# Patient Record
Sex: Male | Born: 1990 | Race: White | Hispanic: No | Marital: Single | Smoking: Never smoker
Health system: Southern US, Community
[De-identification: ages and names within clinical notes are randomized; demographics above are authoritative.]

---

## 2015-05-30 ENCOUNTER — Encounter (HOSPITAL_COMMUNITY): Payer: Self-pay | Admitting: *Deleted

## 2015-05-30 ENCOUNTER — Emergency Department (HOSPITAL_COMMUNITY)
Admission: EM | Admit: 2015-05-30 | Discharge: 2015-05-30 | Disposition: A | Discharge status: Home / Self Care | Payer: Managed Care, Other (non HMO) | Attending: Emergency Medicine | Admitting: Emergency Medicine

## 2015-05-30 ENCOUNTER — Emergency Department (HOSPITAL_COMMUNITY): Payer: Managed Care, Other (non HMO)

## 2015-05-30 DIAGNOSIS — S6991XA Unspecified injury of right wrist, hand and finger(s), initial encounter: Secondary | ICD-10-CM | POA: Diagnosis present

## 2015-05-30 DIAGNOSIS — Y9289 Other specified places as the place of occurrence of the external cause: Secondary | ICD-10-CM | POA: Diagnosis not present

## 2015-05-30 DIAGNOSIS — W25XXXA Contact with sharp glass, initial encounter: Secondary | ICD-10-CM | POA: Diagnosis not present

## 2015-05-30 DIAGNOSIS — Y93G1 Activity, food preparation and clean up: Secondary | ICD-10-CM | POA: Diagnosis not present

## 2015-05-30 DIAGNOSIS — S61411A Laceration without foreign body of right hand, initial encounter: Secondary | ICD-10-CM

## 2015-05-30 DIAGNOSIS — Y999 Unspecified external cause status: Secondary | ICD-10-CM | POA: Insufficient documentation

## 2015-05-30 MED ORDER — FENTANYL CITRATE (PF) 100 MCG/2ML IJ SOLN: 50.0000 ug | Freq: Once | INTRAMUSCULAR | Status: DC

## 2015-05-30 MED ORDER — TETANUS-DIPHTH-ACELL PERTUSSIS 5-2.5-18.5 LF-MCG/0.5 IM SUSP
0.5000 mL | Freq: Once | INTRAMUSCULAR | Status: AC
  Administered 2015-05-30: 0.5 mL via INTRAMUSCULAR
  Filled 2015-05-30: qty 0.5

## 2015-05-30 MED ORDER — LIDOCAINE HCL (PF) 1 % IJ SOLN
5.0000 mL | Freq: Once | INTRAMUSCULAR | Status: AC
  Administered 2015-05-30: 5 mL
  Filled 2015-05-30: qty 5

## 2015-05-30 MED ORDER — IBUPROFEN 100 MG/5ML PO SUSP
800.0000 mg | Freq: Once | ORAL | Status: AC
  Administered 2015-05-30: 800 mg via ORAL
  Filled 2015-05-30: qty 40

## 2015-05-30 NOTE — ED Notes (Signed)
The pt cut his rt index finger while washing dishes.  Bleeding controlled with a bandage

## 2015-05-30 NOTE — ED Provider Notes (Signed)
CSN: [id number]     Arrival date & time 05/30/15  7829 History  By signing my name below, I, Ronney Lion, attest that this documentation has been prepared under the direction and in the presence of Samantha Dowless, PA-C. Electronically Signed: Ronney Lion, ED Scribe. 05/30/2015. 10:44 PM.    Chief Complaint  Patient presents with  . Laceration   The history is provided by the patient. No language interpreter was used.    HPI Comments: Marc Simmons is a 25 y.o. male who presents to the Emergency Department complaining of a sudden-onset, constant right index finger laceration with constant, moderate, associated pain, that onset about 2 hours ago. Patient states he had cut his finger on a glass cup that broke while washing dishes. Movement and palpation exacerbate his pain. He denies a history of chronic medical conditions, including DM or any other conditions that might affect wound-healing. Patient states he believes his tetanus vaccination is out of date.   History reviewed. No pertinent past medical history. History reviewed. No pertinent past surgical history. No family history on file. Social History  Substance Use Topics  . Smoking status: Never Smoker   . Smokeless tobacco: None  . Alcohol Use: Yes    Review of Systems  Skin: Positive for wound.  Neurological: Negative for weakness.    Allergies  Review of patient's allergies indicates no known allergies.  Home Medications   Prior to Admission medications   Not on File   BP 146/86 mmHg  Pulse 62  Temp(Src) 97.6 F (36.4 C) (Oral)  Resp 18  Ht  (1.778 m)  Wt 139 lb (63.05 kg)  BMI 19.94 kg/m2  SpO2 100% Physical Exam  Constitutional: He is oriented to person, place, and time. He appears well-developed and well-nourished. No distress.  HENT:  Head: Normocephalic and atraumatic.  Eyes: Conjunctivae are normal. Right eye exhibits no discharge. Left eye exhibits no discharge. No scleral icterus.  Cardiovascular:  Normal rate and intact distal pulses.   Pulmonary/Chest: Effort normal.  Musculoskeletal:  2 cm laceration over medial aspect of first MCP joint. No foreign bodies seen or palpated. No evidence of tendon injury. No decreased ROM of digits. Good capillary refill (<2 seconds).   Neurological: He is alert and oriented to person, place, and time. Coordination normal.  No sensory deficits.   Skin: Skin is warm and dry. No rash noted. He is not diaphoretic. No erythema. No pallor.  Psychiatric: He has a normal mood and affect. His behavior is normal.  Nursing note and vitals reviewed.   ED Course  Procedures (including critical care time)  DIAGNOSTIC STUDIES: Oxygen Saturation is 100% on RA, normal by my interpretation.    COORDINATION OF CARE: 8:45 PM - Discussed treatment plan with pt at bedside which includes right hand XR to r/o foreign bodies and dosage of pain-relieving medication (ibuprofen) administered here. Pt verbalized understanding and agreed to plan.   Imaging Review Dg Hand Complete Right  05/30/2015  CLINICAL DATA:  Laceration at the base of the second finger from broken cup, possible foreign body EXAM: RIGHT HAND - COMPLETE 3+ VIEW COMPARISON:  None. FINDINGS: No acute fracture or dislocation is noted. Soft tissue abnormality is noted consistent with the given clinical history. No radiopaque foreign body is seen. IMPRESSION: Soft tissue injury without acute bony abnormality or radiopaque foreign body. Electronically Signed   By: Alcide Clever M.D.   On: 05/30/2015 21:18   I have personally reviewed and evaluated these images  and lab results as part of my medical decision-making.  LACERATION REPAIR PROCEDURE NOTE The patient's identification was confirmed and consent was obtained. This procedure was performed by Gaylyn RongSamantha Dowless, PA-C, at 10:30 PM. Site: Right index finger Sterile procedures observed Anesthetic used (type and amt): Lidocaine 1% w/ epi, 5 mL Suture type/size:  5-0 prolene Length: 2 cm # of Sutures: 3 Technique: Simple interrupted Antibx ointment applied Tetanus ordered Site anesthetized, irrigated with NS, explored without evidence of foreign body, wound well approximated, site covered with dry, sterile dressing.  Patient tolerated procedure well without complications. Instructions for care discussed verbally and patient provided with additional written instructions for homecare and f/u.   MDM   Final diagnoses:  Laceration of hand, right, initial encounter   Tdap booster given.Pressure irrigation performed. Laceration occurred < 8 hours prior to repair which was well tolerated. Pt has no co morbidities to effect normal wound healing. Discussed suture home care w pt and answered questions. Pt to f-u for wound check and suture removal in 7 days. Pt is hemodynamically stable w no complaints prior to dc.     I personally performed the services described in this documentation, which was scribed in my presence. The recorded information has been reviewed and is accurate.       Lester KinsmanSamantha Tripp HarwoodDowless, PA-C 05/31/15 0128  Bethann BerkshireJoseph Zammit, MD 05/31/15 [phone]

## 2015-05-30 NOTE — ED Notes (Signed)
Pt to xray at this time.

## 2015-05-30 NOTE — ED Notes (Signed)
PT st's he was washing a glass and it broke.  Pt has lac to base of right index finger.

## 2015-05-30 NOTE — Discharge Instructions (Signed)
Laceration Care, Adult °A laceration is a cut that goes through all of the layers of the skin and into the tissue that is right under the skin. Some lacerations heal on their own. Others need to be closed with stitches (sutures), staples, skin adhesive strips, or skin glue. Proper laceration care minimizes the risk of infection and helps the laceration to heal better. °HOW TO CARE FOR YOUR LACERATION °If sutures or staples were used: °· Keep the wound clean and dry. °· If you were given a bandage (dressing), you should change it at least one time per day or as told by your health care provider. You should also change it if it becomes wet or dirty. °· Keep the wound completely dry for the first 24 hours or as told by your health care provider. After that time, you may shower or bathe. However, make sure that the wound is not soaked in water until after the sutures or staples have been removed. °· Clean the wound one time each day or as told by your health care provider: °¨ Wash the wound with soap and water. °¨ Rinse the wound with water to remove all soap. °¨ Pat the wound dry with a clean towel. Do not rub the wound. °· After cleaning the wound, apply a thin layer of antibiotic ointment as told by your health care provider. This will help to prevent infection and keep the dressing from sticking to the wound. °· Have the sutures or staples removed as told by your health care provider. °If skin adhesive strips were used: °· Keep the wound clean and dry. °· If you were given a bandage (dressing), you should change it at least one time per day or as told by your health care provider. You should also change it if it becomes dirty or wet. °· Do not get the skin adhesive strips wet. You may shower or bathe, but be careful to keep the wound dry. °· If the wound gets wet, pat it dry with a clean towel. Do not rub the wound. °· Skin adhesive strips fall off on their own. You may trim the strips as the wound heals. Do not  remove skin adhesive strips that are still stuck to the wound. They will fall off in time. °If skin glue was used: °· Try to keep the wound dry, but you may briefly wet it in the shower or bath. Do not soak the wound in water, such as by swimming. °· After you have showered or bathed, gently pat the wound dry with a clean towel. Do not rub the wound. °· Do not do any activities that will make you sweat heavily until the skin glue has fallen off on its own. °· Do not apply liquid, cream, or ointment medicine to the wound while the skin glue is in place. Using those may loosen the film before the wound has healed. °· If you were given a bandage (dressing), you should change it at least one time per day or as told by your health care provider. You should also change it if it becomes dirty or wet. °· If a dressing is placed over the wound, be careful not to apply tape directly over the skin glue. Doing that may cause the glue to be pulled off before the wound has healed. °· Do not pick at the glue. The skin glue usually remains in place for 5-10 days, then it falls off of the skin. °General Instructions °· Take over-the-counter and prescription   medicines only as told by your health care provider.  If you were prescribed an antibiotic medicine or ointment, take or apply it as told by your doctor. Do not stop using it even if your condition improves.  To help prevent scarring, make sure to cover your wound with sunscreen whenever you are outside after stitches are removed, after adhesive strips are removed, or when glue remains in place and the wound is healed. Make sure to wear a sunscreen of at least 30 SPF.  Do not scratch or pick at the wound.  Keep all follow-up visits as told by your health care provider. This is important.  Check your wound every day for signs of infection. Watch for:  Redness, swelling, or pain.  Fluid, blood, or pus.  Raise (elevate) the injured area above the level of your heart  while you are sitting or lying down, if possible. SEEK MEDICAL CARE IF:  You received a tetanus shot and you have swelling, severe pain, redness, or bleeding at the injection site.  You have a fever.  A wound that was closed breaks open.  You notice a bad smell coming from your wound or your dressing.  You notice something coming out of the wound, such as wood or glass.  Your pain is not controlled with medicine.  You have increased redness, swelling, or pain at the site of your wound.  You have fluid, blood, or pus coming from your wound.  You notice a change in the color of your skin near your wound.  You need to change the dressing frequently due to fluid, blood, or pus draining from the wound.  You develop a new rash.  You develop numbness around the wound. SEEK IMMEDIATE MEDICAL CARE IF:  You develop severe swelling around the wound.  Your pain suddenly increases and is severe.  You develop painful lumps near the wound or on skin that is anywhere on your body.  You have a red streak going away from your wound.  The wound is on your hand or foot and you cannot properly move a finger or toe.  The wound is on your hand or foot and you notice that your fingers or toes look pale or bluish.   This information is not intended to replace advice given to you by your health care provider. Make sure you discuss any questions you have with your health care provider.   Follow-up with your primary care provider or an urgent care for suture removal in 7-10 days. Keep wound clean and dry. May wash with soap and water. Return to the emergency department if you experience redness or swelling around the wound, inability to move your finger, fevers, chills.

## 2017-03-24 IMAGING — CR DG HAND COMPLETE 3+V*R*
3 series · 3 of 3 positions shown · non-contrast
Comparison: None.

CLINICAL DATA: Laceration at the base of the second finger from
broken cup, possible foreign body

EXAM:
RIGHT HAND - COMPLETE 3+ VIEW

[hand pa]
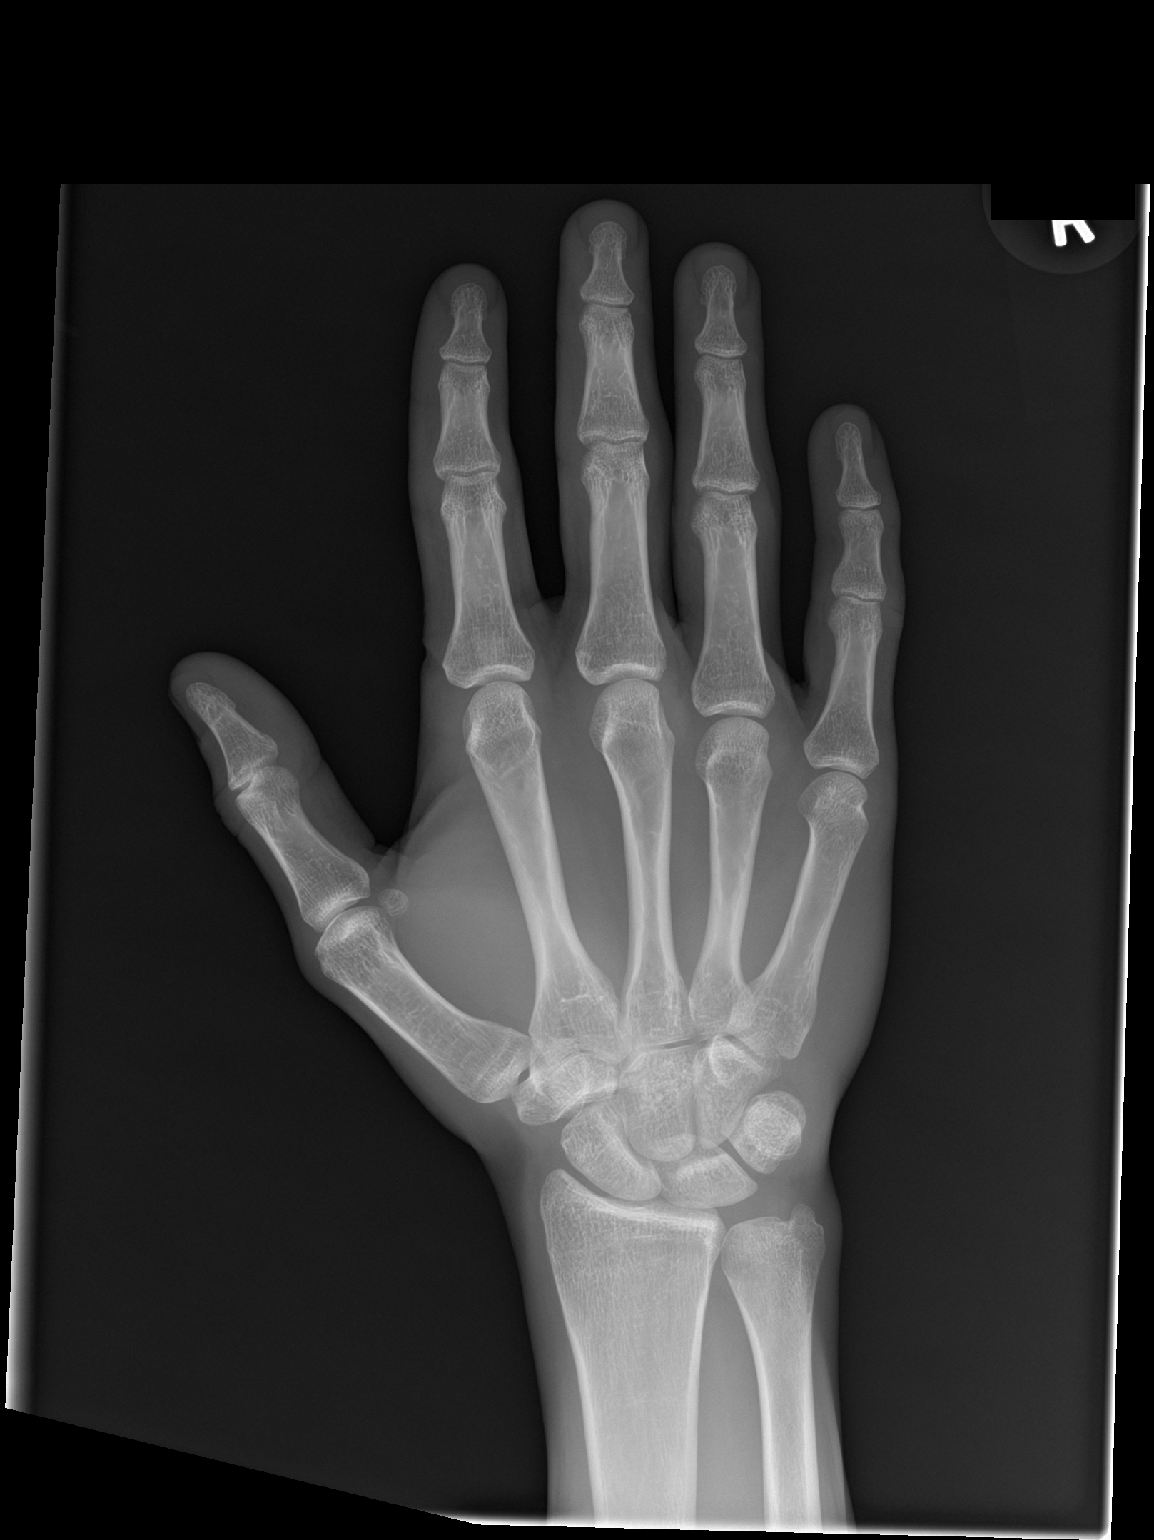

[hand obl]
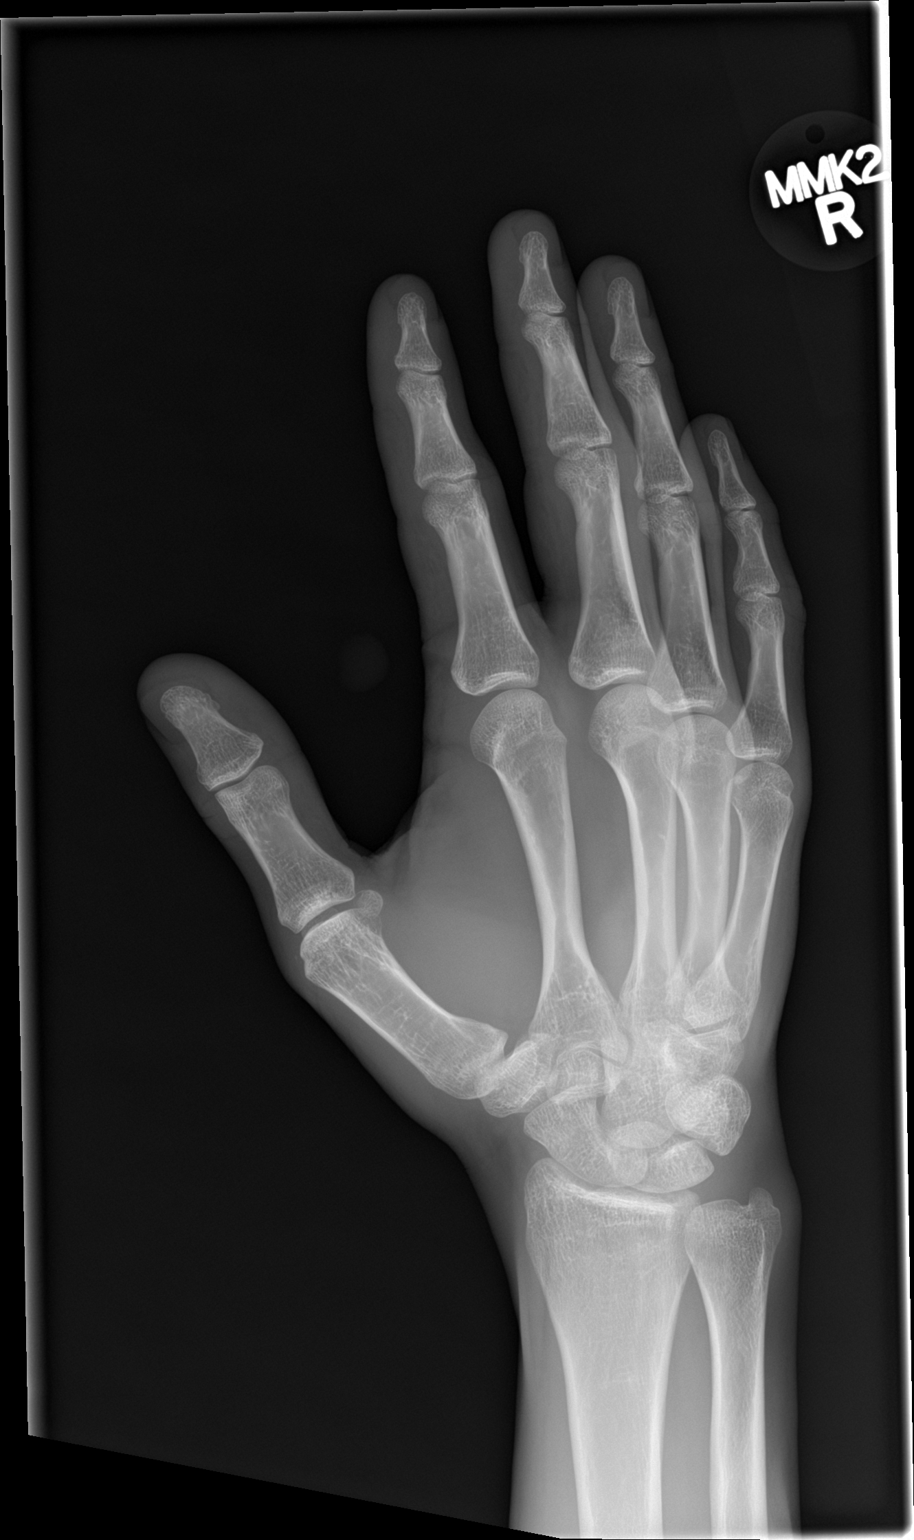

[hand lat]
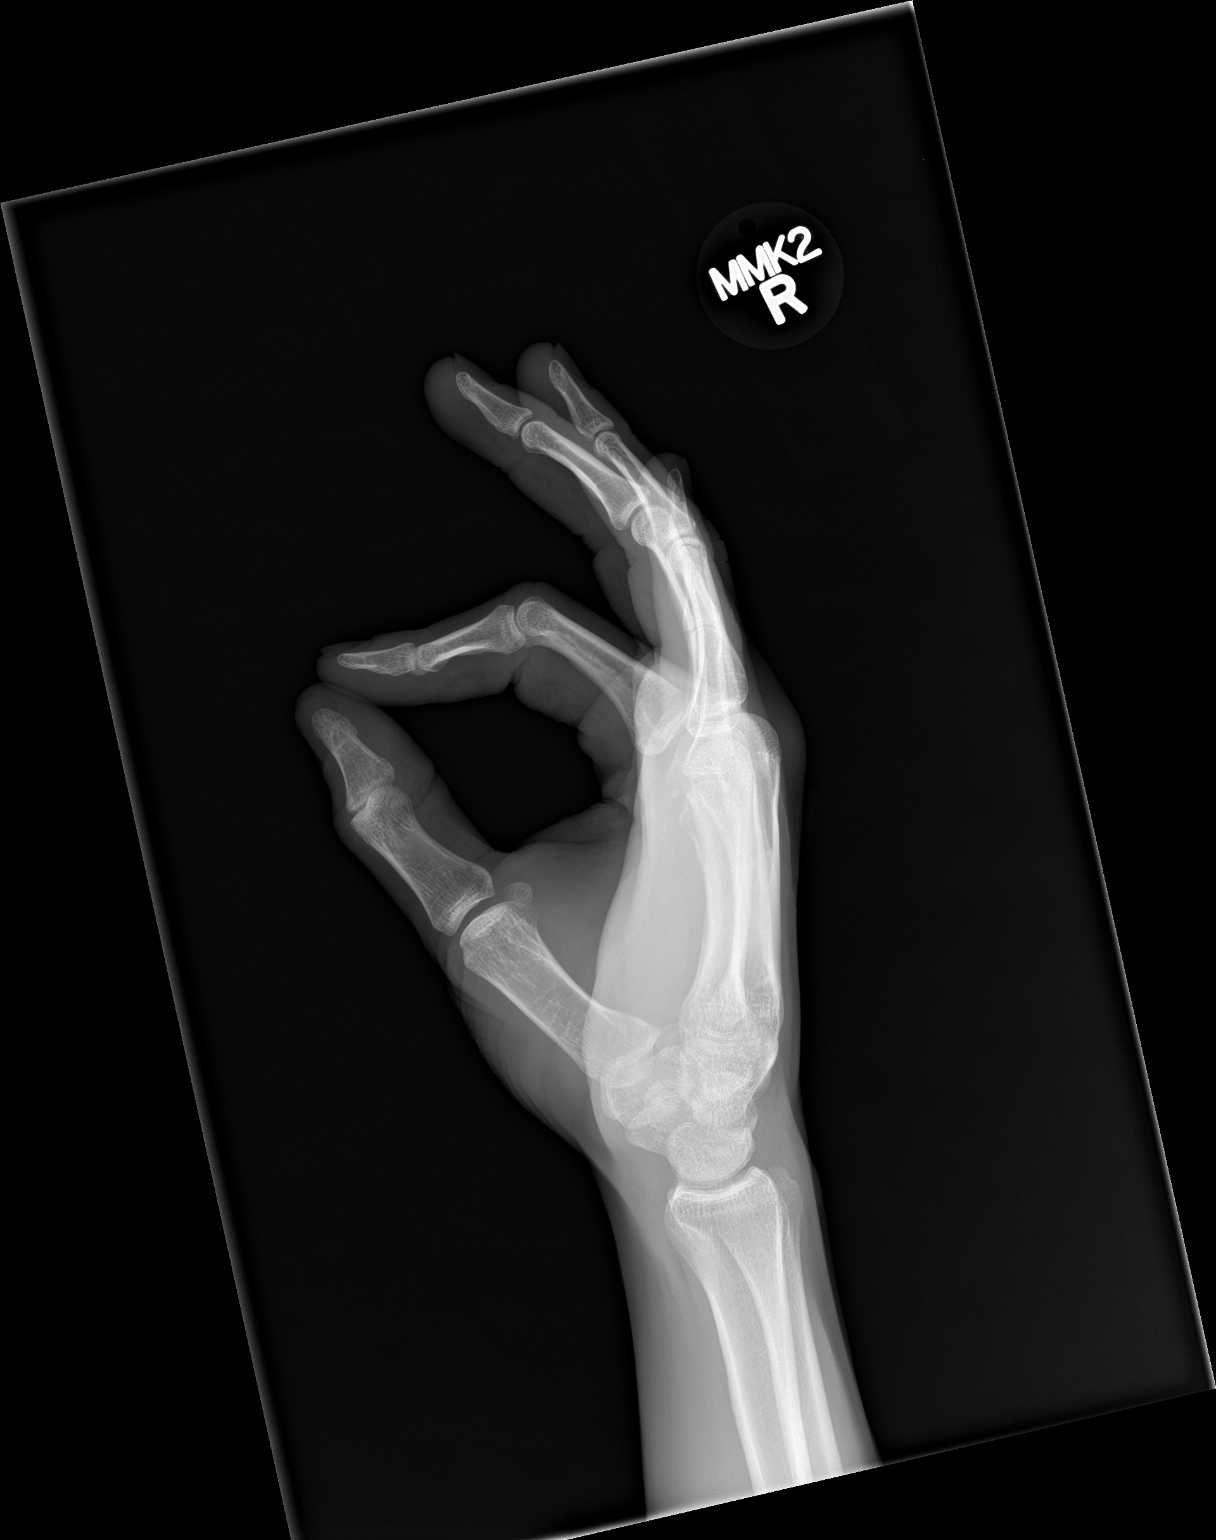

[3 of 3 positions shown; findings below may reference images not displayed]

FINDINGS: No acute fracture or dislocation is noted. Soft tissue abnormality
is noted consistent with the given clinical history. No radiopaque
foreign body is seen.
IMPRESSION: Soft tissue injury without acute bony abnormality or radiopaque
foreign body.
